# Patient Record
Sex: Male | Born: 1970 | Race: Black or African American | Marital: Married | Smoking: Former smoker
Health system: Southern US, Community
[De-identification: ages and names within clinical notes are randomized; demographics above are authoritative.]

## PROBLEM LIST (undated history)

## (undated) DIAGNOSIS — Z972 Presence of dental prosthetic device (complete) (partial): Secondary | ICD-10-CM

## (undated) DIAGNOSIS — M270 Developmental disorders of jaws: Secondary | ICD-10-CM

## (undated) DIAGNOSIS — H5210 Myopia, unspecified eye: Secondary | ICD-10-CM

## (undated) DIAGNOSIS — G43909 Migraine, unspecified, not intractable, without status migrainosus: Secondary | ICD-10-CM

## (undated) HISTORY — PX: VASECTOMY: SHX75

## (undated) HISTORY — DX: Migraine, unspecified, not intractable, without status migrainosus: G43.909

## (undated) HISTORY — PX: TONSILLECTOMY AND ADENOIDECTOMY: SUR1326

## (undated) HISTORY — DX: Presence of dental prosthetic device (complete) (partial): Z97.2

## (undated) HISTORY — DX: Myopia, unspecified eye: H52.10

## (undated) HISTORY — DX: Developmental disorders of jaws: M27.0

---

## 1998-08-23 HISTORY — PX: VASECTOMY: SHX75

## 2012-01-13 ENCOUNTER — Encounter: Payer: Self-pay | Admitting: Medical

## 2012-01-13 ENCOUNTER — Ambulatory Visit (INDEPENDENT_AMBULATORY_CARE_PROVIDER_SITE_OTHER): Payer: BC Managed Care – PPO | Admitting: Medical

## 2012-01-13 VITALS — BP 110/80 | HR 60 | Temp 97.6°F | Resp 16 | Ht 75.0 in | Wt 196.0 lb

## 2012-01-13 DIAGNOSIS — M79609 Pain in unspecified limb: Secondary | ICD-10-CM

## 2012-01-13 DIAGNOSIS — Z23 Encounter for immunization: Secondary | ICD-10-CM

## 2012-01-13 DIAGNOSIS — M79641 Pain in right hand: Secondary | ICD-10-CM

## 2012-01-13 DIAGNOSIS — Z0000 Encounter for general adult medical examination without abnormal findings: Secondary | ICD-10-CM

## 2012-01-13 LAB — COMPREHENSIVE METABOLIC PANEL
ALT: 29 U/L (ref 0–53)
AST: 20 U/L (ref 0–37)
Albumin: 5 g/dL (ref 3.5–5.2)
Alkaline Phosphatase: 79 U/L (ref 39–117)
Potassium: 4.1 mEq/L (ref 3.5–5.3)
Sodium: 140 mEq/L (ref 135–145)
Total Bilirubin: 0.4 mg/dL (ref 0.3–1.2)
Total Protein: 7.5 g/dL (ref 6.0–8.3)

## 2012-01-13 LAB — CBC WITH DIFFERENTIAL/PLATELET
Basophils Relative: 0 % (ref 0–1)
Eosinophils Absolute: 0.1 10*3/uL (ref 0.0–0.7)
Eosinophils Relative: 2 % (ref 0–5)
Lymphs Abs: 2.3 10*3/uL (ref 0.7–4.0)
MCH: 28.9 pg (ref 26.0–34.0)
MCHC: 33.8 g/dL (ref 30.0–36.0)
MCV: 85.6 fL (ref 78.0–100.0)
Platelets: 259 10*3/uL (ref 150–400)
RBC: 5.4 MIL/uL (ref 4.22–5.81)
RDW: 14.3 % (ref 11.5–15.5)

## 2012-01-13 LAB — POCT URINALYSIS DIPSTICK
Glucose, UA: NEGATIVE
Leukocytes, UA: NEGATIVE
Nitrite, UA: NEGATIVE
Protein, UA: NEGATIVE
Urobilinogen, UA: NEGATIVE

## 2012-01-13 LAB — LIPID PANEL: Total CHOL/HDL Ratio: 3.4 Ratio

## 2012-01-13 NOTE — Progress Notes (Signed)
Subjective:   HPI  Jorge Bradshaw is a 41 y.o. male who presents for a complete physical.  He is a new patient today.  He moved here few years ago from [place].  Formerly played basketball in college.  His only c/o today other than getting a physical is right hand with new growth that begin about a week ago.  The area has not resolved or worsened, but he is right handed and it is noticeable.  Not painful now though.   Reviewed their medical, surgical, family, social, medication, and allergy history and updated chart as appropriate.  Preventative care: Last tetanus >10 years. Gets flu shot yearly Prostate check - none prior Last dental visit - 3-4 years ago Last eye doctor visit over a year ago.    Past Medical History  Diagnosis Date  . Nearsightedness     wears glasses   . Wears partial dentures   . Migraines     Past Surgical History  Procedure Date  . Tonsillectomy and adenoidectomy     Family History  Problem Relation Age of Onset  . Cancer Father     prostate  . Cancer Maternal Uncle     died of colon cancer  . Heart disease Neg Hx   . Stroke Neg Hx   . Hypertension Neg Hx   . Diabetes Neg Hx     History   Social History  . Marital Status: Married    Spouse Name: N/A    Number of Children: N/A  . Years of Education: N/A   Occupational History  . supervisor [employer]   Social History Main Topics  . Smoking status: Current Some Day Smoker    Types: Cigarettes  . Smokeless tobacco: Not on file  . Alcohol Use: 0.6 oz/week    1 Cans of beer per week  . Drug Use: No  . Sexually Active: Not on file   Other Topics Concern  . Not on file   Social History Narrative   Married, 1 daughter graduating high school, exercise - 3 days per week walking, lifting weights    No current outpatient prescriptions on file prior to visit.    Allergies  Allergen Reactions  . Shellfish Allergy Anaphylaxis    Review of Systems Constitutional: -fever,  -chills, -sweats, -unexpected weight change, -anorexia, -fatigue Allergy: -sneezing, -itching, -congestion Dermatology: denies changing moles, rash, lumps, new worrisome lesions ENT: -runny nose, -ear pain, -sore throat, -hoarseness, -sinus pain, -teeth pain, -tinnitus, -hearing loss, -epistaxis Cardiology:  -chest pain, -palpitations, -edema, -orthopnea, -paroxysmal nocturnal dyspnea Respiratory: -cough, -shortness of breath, -dyspnea on exertion, -wheezing, -hemoptysis Gastroenterology: -abdominal pain, -nausea, -vomiting, -diarrhea, -constipation, -blood in stool, -changes in bowel movement, -dysphagia Hematology: -bleeding or bruising problems Musculoskeletal: -arthralgias, -myalgias, +joint swelling, -back pain, -neck pain, -cramping, -gait changes Ophthalmology: +vision changes, -eye redness, -itching, -discharge Urology: -dysuria, -difficulty urinating, -hematuria, -urinary frequency, -urgency, incontinence Neurology: -headache, -weakness, -tingling, -numbness, -speech abnormality, -memory loss, -falls, -dizziness Psychology:  -depressed mood, -agitation, -sleep problems    Objective:   Physical Exam  Filed Vitals:   01/13/12 0924  BP: 110/80  Pulse: 60  Temp: 97.6 F (36.4 C)  Resp: 16    General appearance: alert, no distress, WD/WN, tall AA male Skin: tattoos right upper arm, upper back HEENT: normocephalic, conjunctiva/corneas normal, sclerae anicteric, PERRLA, EOMi, nares patent, no discharge or erythema, pharynx normal Oral cavity: MMM, tongue normal, teeth with upper denture, others in good repair Neck: supple, no lymphadenopathy, no  thyromegaly, no masses, normal ROM, no bruits Chest: non tender, normal shape and expansion Heart: RRR, normal S1, S2, no murmurs Lungs: CTA bilaterally, no wheezes, rhonchi, or rales Abdomen: +bs, soft, non tender, non distended, no masses, no hepatomegaly, no splenomegaly, no bruits Back: non tender, normal ROM, no  scoliosis Musculoskeletal: right hand volar surface, 8mm oval dense lesion between 2nd and 3rd metacarpal, possibly a cyst or ganglion cyst, nontender, not within tendon sheath, otherwise upper extremities non tender, no obvious deformity, normal ROM throughout, lower extremities non tender, no obvious deformity, normal ROM throughout Extremities: no edema, no cyanosis, no clubbing Pulses: 2+ symmetric, upper and lower extremities, normal cap refill Neurological: alert, oriented x 3, CN2-12 intact, strength normal upper extremities and lower extremities, sensation normal throughout, DTRs 2+ throughout, no cerebellar signs, gait normal Psychiatric: normal affect, behavior normal, pleasant  GU: normal male external genitalia, nontender, no masses, no hernia, no lymphadenopathy Rectal: deferred  Assessment and Plan :    Encounter Diagnoses  Name Primary?  . Routine general medical examination at a health care facility Yes  . Need for Tdap vaccination   . Hand pain, right     Physical exam - discussed healthy lifestyle, diet, exercise, preventative care, vaccinations, and addressed their concerns.    Vaccine counseling today, VIS given, and Tdap given.  Hand pain - possible cyst.  Reassured, and we will use watch and wait approach for now.  Follow-up pending labs.

## 2012-01-13 NOTE — Patient Instructions (Signed)
Preventative Care for Adults, Male       REGULAR HEALTH EXAMS:  A routine yearly physical is a good way to check in with your primary care provider about your health and preventive screening. It is also an opportunity to share updates about your health and any concerns you have, and receive a thorough all-over exam.   Most health insurance companies pay for at least some preventative services.  Check with your health plan for specific coverages.  WHAT PREVENTATIVE SERVICES DO MEN NEED?  Adult men should have their weight and blood pressure checked regularly.   Men age 35 and older should have their cholesterol levels checked regularly.  Beginning at age 50 and continuing to age 75, men should be screened for colorectal cancer.  Certain people should may need continued testing until age 85.  Other cancer screening may include exams for testicular and prostate cancer.  Updating vaccinations is part of preventative care.  Vaccinations help protect against diseases such as the flu.  Lab tests are generally done as part of preventative care to screen for anemia and blood disorders, to screen for problems with the kidneys and liver, to screen for bladder problems, to check blood sugar, and to check your cholesterol level.  Preventative services generally include counseling about diet, exercise, avoiding tobacco, drugs, excessive alcohol consumption, and sexually transmitted infections.    GENERAL RECOMMENDATIONS FOR GOOD HEALTH:  Healthy diet:  Eat a variety of foods, including fruit, vegetables, animal or vegetable protein, such as meat, fish, chicken, and eggs, or beans, lentils, tofu, and grains, such as rice.  Drink plenty of water daily.  Decrease saturated fat in the diet, avoid lots of red meat, processed foods, sweets, fast foods, and fried foods.  Exercise:  Aerobic exercise helps maintain good heart health. At least 30-40 minutes of moderate-intensity exercise is recommended.  For example, a brisk walk that increases your heart rate and breathing. This should be done on most days of the week.   Find a type of exercise or a variety of exercises that you enjoy so that it becomes a part of your daily life.  Examples are running, walking, swimming, water aerobics, and biking.  For motivation and support, explore group exercise such as aerobic class, spin class, Zumba, Yoga,or  martial arts, etc.    Set exercise goals for yourself, such as a certain weight goal, walk or run in a race such as a 5k walk/run.  Speak to your primary care provider about exercise goals.  Disease prevention:  If you smoke or chew tobacco, find out from your caregiver how to quit. It can literally save your life, no matter how long you have been a tobacco user. If you do not use tobacco, never begin.   Maintain a healthy diet and normal weight. Increased weight leads to problems with blood pressure and diabetes.   The Body Mass Index or BMI is a way of measuring how much of your body is fat. Having a BMI above 27 increases the risk of heart disease, diabetes, hypertension, stroke and other problems related to obesity. Your caregiver can help determine your BMI and based on it develop an exercise and dietary program to help you achieve or maintain this important measurement at a healthful level.  High blood pressure causes heart and blood vessel problems.  Persistent high blood pressure should be treated with medicine if weight loss and exercise do not work.   Fat and cholesterol leaves deposits in your arteries   that can block them. This causes heart disease and vessel disease elsewhere in your body.  If your cholesterol is found to be high, or if you have heart disease or certain other medical conditions, then you may need to have your cholesterol monitored frequently and be treated with medication.   Ask if you should have a stress test if your history suggests this. A stress test is a test done on  a treadmill that looks for heart disease. This test can find disease prior to there being a problem.  Avoid drinking alcohol in excess (more than two drinks per day).  Avoid use of street drugs. Do not share needles with anyone. Ask for professional help if you need assistance or instructions on stopping the use of alcohol, cigarettes, and/or drugs.  Brush your teeth twice a day with fluoride toothpaste, and floss once a day. Good oral hygiene prevents tooth decay and gum disease. The problems can be painful, unattractive, and can cause other health problems. Visit your dentist for a routine oral and dental check up and preventive care every 6-12 months.   Look at your skin regularly.  Use a mirror to look at your back. Notify your caregivers of changes in moles, especially if there are changes in shapes, colors, a size larger than a pencil eraser, an irregular border, or development of new moles.  Safety:  Use seatbelts 100% of the time, whether driving or as a passenger.  Use safety devices such as hearing protection if you work in environments with loud noise or significant background noise.  Use safety glasses when doing any work that could send debris in to the eyes.  Use a helmet if you ride a bike or motorcycle.  Use appropriate safety gear for contact sports.  Talk to your caregiver about gun safety.  Use sunscreen with a SPF (or skin protection factor) of 15 or greater.  Lighter skinned people are at a greater risk of skin cancer. Don't forget to also wear sunglasses in order to protect your eyes from too much damaging sunlight. Damaging sunlight can accelerate cataract formation.   Practice safe sex. Use condoms. Condoms are used for birth control and to help reduce the spread of sexually transmitted infections (or STIs).  Some of the STIs are gonorrhea (the clap), chlamydia, syphilis, trichomonas, herpes, HPV (human papilloma virus) and HIV (human immunodeficiency virus) which causes AIDS.  The herpes, HIV and HPV are viral illnesses that have no cure. These can result in disability, cancer and death.   Keep carbon monoxide and smoke detectors in your home functioning at all times. Change the batteries every 6 months or use a model that plugs into the wall.   Vaccinations:  Stay up to date with your tetanus shots and other required immunizations. You should have a booster for tetanus every 10 years. Be sure to get your flu shot every year, since 5%-20% of the [place] population comes down with the flu. The flu vaccine changes each year, so being vaccinated once is not enough. Get your shot in the fall, before the flu season peaks.   Other vaccines to consider:  Pneumococcal vaccine to protect against certain types of pneumonia.  This is normally recommended for adults age 65 or older.  However, adults younger than 40 years old with certain underlying conditions such as diabetes, heart or lung disease should also receive the vaccine.  Shingles vaccine to protect against Varicella Zoster if you are older than age 60, or younger   than 40 years old with certain underlying illness.  Hepatitis A vaccine to protect against a form of infection of the liver by a virus acquired from food.  Hepatitis B vaccine to protect against a form of infection of the liver by a virus acquired from blood or body fluids, particularly if you work in health care.  If you plan to travel internationally, check with your local health department for specific vaccination recommendations.  Cancer Screening:  Most routine colon cancer screening begins at the age of 50. On a yearly basis, doctors may provide special easy to use take-home tests to check for hidden blood in the stool. Sigmoidoscopy or colonoscopy can detect the earliest forms of colon cancer and is life saving. These tests use a small camera at the end of a tube to directly examine the colon. Speak to your caregiver about this at age 50, when routine  screening begins (and is repeated every 5 years unless early forms of pre-cancerous polyps or small growths are found).   At the age of 50 men usually start screening for prostate cancer every year. Screening may begin at a younger age for those with higher risk. Those at higher risk include African-Americans or having a family history of prostate cancer. There are two types of tests for prostate cancer:   Prostate-specific antigen (PSA) testing. Recent studies raise questions about prostate cancer using PSA and you should discuss this with your caregiver.   Digital rectal exam (in which your doctor's lubricated and gloved finger feels for enlargement of the prostate through the anus).   Screening for testicular cancer.  Do a monthly exam of your testicles. Gently roll each testicle between your thumb and fingers, feeling for any abnormal lumps. The best time to do this is after a hot shower or bath when the tissues are looser. Notify your caregivers of any lumps, tenderness or changes in size or shape immediately.     

## 2012-03-09 ENCOUNTER — Telehealth: Payer: Self-pay

## 2012-03-09 ENCOUNTER — Encounter: Payer: Self-pay | Admitting: Family Medicine

## 2012-03-09 ENCOUNTER — Ambulatory Visit (INDEPENDENT_AMBULATORY_CARE_PROVIDER_SITE_OTHER): Payer: BC Managed Care – PPO | Admitting: Family Medicine

## 2012-03-09 VITALS — BP 118/80 | HR 63 | Wt 199.0 lb

## 2012-03-09 DIAGNOSIS — L989 Disorder of the skin and subcutaneous tissue, unspecified: Secondary | ICD-10-CM

## 2012-03-09 NOTE — Telephone Encounter (Signed)
Pt has appt with ortho ahd hand specialist [phone] [address] July 23 @ 9:30 am pt aware

## 2012-03-09 NOTE — Progress Notes (Signed)
  Subjective:    Patient ID: Jorge Bradshaw, male    DOB: 20-May-1971, 41 y.o.   MRN: [id number]  HPI He is here for evaluation of continued difficulty with a lesion in the palm of his right hand. Apparently is getting larger now interfering with his ability to function.   Review of Systems     Objective:   Physical Exam [place] round lesion noted in the palm of the hand. It does not move with motion of the tendons.       Assessment & Plan:   1. Hand lesion  Ambulatory referral to Orthopedic Surgery   since this is giving him more difficulty with his ADLs, referral is appropriate

## 2012-03-23 HISTORY — PX: HAND SURGERY: SHX662

## 2012-08-25 ENCOUNTER — Encounter: Payer: Self-pay | Admitting: Medical

## 2012-08-25 ENCOUNTER — Ambulatory Visit (INDEPENDENT_AMBULATORY_CARE_PROVIDER_SITE_OTHER): Payer: 59 | Admitting: Medical

## 2012-08-25 VITALS — BP 120/80 | HR 92 | Temp 98.5°F | Resp 18 | Wt 198.0 lb

## 2012-08-25 DIAGNOSIS — J40 Bronchitis, not specified as acute or chronic: Secondary | ICD-10-CM

## 2012-08-25 MED ORDER — HYDROCODONE-HOMATROPINE 5-1.5 MG/5ML PO SYRP: 5.0000 mL | ORAL_SOLUTION | Freq: Three times a day (TID) | ORAL | Status: DC | PRN

## 2012-08-25 MED ORDER — AZITHROMYCIN 500 MG PO TABS: 500.0000 mg | ORAL_TABLET | Freq: Every day | ORAL | Status: AC

## 2012-08-25 NOTE — Progress Notes (Signed)
Subjective:  Jorge Bradshaw is a 42 y.o. male who presents for 2 wk hx/o cough.  Was originally having cough, congestion in chest, got a little better, but then worse again.  Going on  2wk now.  Having productive sputum.  Denies fever, sinus pressure, ear pain, sore throat, edema, SOB, wheezing, NVD. Using Mucinex, Vicks Vapor rub, cough lozenges.  He is a non smoker.  No other aggravating or relieving factors.  No other c/o.  The following portions of the patient's history were reviewed and updated as appropriate: allergies, current medications, past family history, past medical history, past social history, past surgical history and problem list.   Objective:   Filed Vitals:   08/25/12 1244  BP: 120/80  Pulse: 92  Temp: 98.5 F (36.9 C)  Resp: 18    General appearance: Alert, WD/WN, no distress                             Skin: warm, no rash, no diaphoresis                           Head: no sinus tenderness                            Eyes: conjunctiva normal, corneas clear, PERRLA                            Ears: pearly TMs, external ear canals normal                          Nose: septum midline, turbinates swollen, with erythema and clear discharge             Mouth/throat: MMM, tongue normal, mild pharyngeal erythema                           Neck: supple, no adenopathy, no thyromegaly, nontender                          Heart: RRR, normal S1, S2, no murmurs                         Lungs: +bronchial breath sounds, +scattered rhonchi, no wheezes, no rales                Extremities: no edema, nontender     Assessment and Plan:   Encounter Diagnosis  Name Primary?  . Bronchitis Yes    Prescription given today for Azithromycin and Hycodan syrup as below.  Discussed diagnosis and treatment.  Suggested symptomatic OTC remedies for cough and congestion.  Tylenol or Ibuprofen OTC for fever and malaise.  Call/return in 2-3 days if symptoms are worse or not improving.

## 2013-01-05 ENCOUNTER — Encounter: Payer: Self-pay | Admitting: Medical

## 2013-01-05 ENCOUNTER — Ambulatory Visit (INDEPENDENT_AMBULATORY_CARE_PROVIDER_SITE_OTHER): Payer: 59 | Admitting: Medical

## 2013-01-05 VITALS — BP 120/80 | HR 60 | Temp 97.6°F | Resp 16 | Wt 203.0 lb

## 2013-01-05 DIAGNOSIS — R2231 Localized swelling, mass and lump, right upper limb: Secondary | ICD-10-CM

## 2013-01-05 DIAGNOSIS — R229 Localized swelling, mass and lump, unspecified: Secondary | ICD-10-CM

## 2013-01-05 NOTE — Progress Notes (Signed)
Subjective: Here for recheck on hand.  5/13 ended up seeing hand surgeon who removed a benign lesion from between his right hand 2nd and 3rd fingers.  Did fine up until 1/14 had been using lotion to help with scar tissue.   Now he has new knots in the same area.    Objective: Gen: wd, wn, nad Skin: unremarkable, small surgical scar of right volar hand along 2nd metacarpal MSK: right volar hand with 3 distinct masses/nodular tissue, nodules, raised along volar 3rd 4th and 5th metacarpals, no obvious trigger finger or locking of fingers, finger ROM normal Neurovascularly intact right UE  Assessment: Encounter Diagnosis  Name Primary?  . Mass of hand, right Yes   Plan: Most likely Dupuytren's contracture vs bone spurs causing pain and raised deformity.  Refer back to [employer] [facility] [place].

## 2013-01-24 ENCOUNTER — Ambulatory Visit (INDEPENDENT_AMBULATORY_CARE_PROVIDER_SITE_OTHER): Payer: 59 | Admitting: Medical

## 2013-01-24 ENCOUNTER — Encounter: Payer: Self-pay | Admitting: Medical

## 2013-01-24 VITALS — BP 120/82 | HR 58 | Temp 97.6°F | Resp 16 | Ht 75.0 in | Wt 204.0 lb

## 2013-01-24 DIAGNOSIS — Z125 Encounter for screening for malignant neoplasm of prostate: Secondary | ICD-10-CM

## 2013-01-24 DIAGNOSIS — Z87891 Personal history of nicotine dependence: Secondary | ICD-10-CM

## 2013-01-24 DIAGNOSIS — Z0000 Encounter for general adult medical examination without abnormal findings: Secondary | ICD-10-CM

## 2013-01-24 DIAGNOSIS — R2231 Localized swelling, mass and lump, right upper limb: Secondary | ICD-10-CM

## 2013-01-24 DIAGNOSIS — R229 Localized swelling, mass and lump, unspecified: Secondary | ICD-10-CM

## 2013-01-24 LAB — POCT URINALYSIS DIPSTICK
Glucose, UA: NEGATIVE
Ketones, UA: NEGATIVE
Leukocytes, UA: NEGATIVE
Protein, UA: NEGATIVE
Urobilinogen, UA: NEGATIVE

## 2013-01-24 NOTE — Progress Notes (Signed)
Subjective:   HPI  Jorge Bradshaw is a 42 y.o. male who presents for a complete physical.   Preventative care: Last ophthalmology visit:yes- [facility] Last dental visit:n/a Last colonoscopy:n/a Last prostate exam: n/a Last EKG:n/a Last labs:01/13/12  Prior vaccinations: TD or Tdap:01/13/12 Influenza:n/a Pneumococcal:n/a Shingles/Zostavax/a:  Advanced directive:n/a Health care power of attorney:n/a Living will:n/a  Concerns: none  Reviewed their medical, surgical, family, social, medication, and allergy history and updated chart as appropriate.   Past Medical History  Diagnosis Date  . Nearsightedness     wears glasses   . Wears partial dentures   . Migraines   . Torus palatinus     Past Surgical History  Procedure Laterality Date  . Tonsillectomy and adenoidectomy    . Hand surgery  8/13    volar right hand mass excised - Dr. Dairl Ponder    Family History  Problem Relation Age of Onset  . Cancer Father     prostate  . Cancer Maternal Uncle     died of colon cancer  . Heart disease Neg Hx   . Stroke Neg Hx   . Hypertension Neg Hx   . Diabetes Neg Hx     History   Social History  . Marital Status: Married    Spouse Name: N/A    Number of Children: N/A  . Years of Education: N/A   Occupational History  . supervisor [employer]   Social History Main Topics  . Smoking status: Former Smoker    Types: Cigarettes    Quit date: 02/24/2012  . Smokeless tobacco: Not on file  . Alcohol Use: 0.6 oz/week    1 Cans of beer per week  . Drug Use: No  . Sexually Active: Not on file   Other Topics Concern  . 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   No current facility-administered medications on file prior to visit.    Allergies  Allergen  Reactions  . Shellfish Allergy Anaphylaxis     Review of Systems Constitutional: -fever, -chills, -sweats, -unexpected weight change, -decreased appetite, -fatigue Allergy: -sneezing, -itching, -congestion Dermatology: -changing moles, --rash, -lumps ENT: -runny nose, -ear pain, -sore throat, -hoarseness, -sinus pain, -teeth pain, - ringing in ears, -hearing loss, -nosebleeds Cardiology: -chest pain, -palpitations, -swelling, -difficulty breathing when lying flat, -waking up short of breath Respiratory: -cough, -shortness of breath, -difficulty breathing with exercise or exertion, -wheezing, -coughing up blood Gastroenterology: -abdominal pain, -nausea, -vomiting, -diarrhea, -constipation, -blood in stool, -changes in bowel movement, -difficulty swallowing or eating Hematology: -bleeding, -bruising  Musculoskeletal: -joint aches, -muscle aches, -joint swelling, -back pain, -neck pain, -cramping, -changes in gait Ophthalmology: denies vision changes, eye redness, itching, discharge Urology: -burning with urination, -difficulty urinating, -blood in urine, -urinary frequency, -urgency, -incontinence Neurology: -headache, -weakness, -tingling, -numbness, -memory loss, -falls, -dizziness Psychology: -depressed mood, -agitation, -sleep problems     Objective:   Physical Exam  Vitals and nurse notes reviwed  General appearance: alert, no distress, WD/WN, tall AA male  Skin: tattoos right upper arm, upper back  HEENT: normocephalic, conjunctiva/corneas normal, sclerae anicteric, PERRLA, EOMi, nares patent, no discharge or erythema, pharynx normal  Oral cavity: MMM, tongue normal, teeth with upper denture, torus palatinus noted, teeth in good repair  Neck: supple, no lymphadenopathy, no thyromegaly, no masses,  normal ROM, no bruits  Chest: non tender, normal shape and expansion  Heart: RRR, normal S1, S2, no murmurs  Lungs: CTA bilaterally, no wheezes, rhonchi, or rales  Abdomen: +bs, soft,  non tender, non distended, no masses, no hepatomegaly, no splenomegaly, no bruits  Back: non tender, normal ROM, no scoliosis  Musculoskeletal: right hand volar surface with 3 distinct masses/nodular tissue, nodules, raised along volar 3rd 4th and 5th metacarpals, no obvious trigger finger or locking of fingers, finger ROM normal, otherwise upper extremities non tender, no obvious deformity, normal ROM throughout, lower extremities non tender, no obvious deformity, normal ROM throughout  Extremities: no edema, no cyanosis, no clubbing  Pulses: 2+ symmetric, upper and lower extremities, normal cap refill  Neurological: alert, oriented x 3, CN2-12 intact, strength normal upper extremities and lower extremities, sensation normal throughout, DTRs 2+ throughout, no cerebellar signs, gait normal  Psychiatric: normal affect, behavior normal, pleasant  GU: normal male external genitalia, circumcised, nontender, no masses, no hernia, no lymphadenopathy  Rectal: anus normal tone, prostate WNL, no nodules   Assessment and Plan :      Encounter Diagnoses  Name Primary?  . Routine general medical examination at a health care facility Yes  . Screening for prostate cancer   . Former smoker   . Mass of hand, right     Physical exam - discussed healthy lifestyle, diet, exercise, preventative care, vaccinations, and addressed their concerns.  Reviewed the normal labs from last year.  He will get me copy of his recent labs through [facility] health [facility].  We discussed risks/benefits of PSA testing.  Baseline PSA test today.    Glad to hear he stopped tobacco.   congratulated him on this.    Mass of hand - f/u with hand surgeon as planned.   F/u pending labs

## 2013-01-24 NOTE — Patient Instructions (Signed)
Dr. David Civils, dentist [address], [place], [place] [address] [phone] [email or web address]    

## 2013-01-25 LAB — PSA: PSA: 0.77 ng/mL (ref <=4.00)

## 2013-01-26 ENCOUNTER — Encounter: Payer: Self-pay | Admitting: Medical

## 2013-01-26 ENCOUNTER — Ambulatory Visit (INDEPENDENT_AMBULATORY_CARE_PROVIDER_SITE_OTHER): Payer: 59 | Admitting: Medical

## 2013-01-26 VITALS — BP 120/80 | HR 68 | Temp 97.6°F | Resp 16 | Wt 199.0 lb

## 2013-01-26 DIAGNOSIS — R300 Dysuria: Secondary | ICD-10-CM

## 2013-01-26 DIAGNOSIS — R369 Urethral discharge, unspecified: Secondary | ICD-10-CM

## 2013-01-26 LAB — POCT URINALYSIS DIPSTICK
Bilirubin, UA: NEGATIVE
Glucose, UA: NEGATIVE
Nitrite, UA: NEGATIVE
Urobilinogen, UA: NEGATIVE

## 2013-01-26 MED ORDER — CIPROFLOXACIN HCL 500 MG PO TABS: 500.0000 mg | ORAL_TABLET | Freq: Two times a day (BID) | ORAL | Status: DC

## 2013-01-26 NOTE — Progress Notes (Signed)
Quick Note:  Patient is aware of lab results. CLS ______

## 2013-01-26 NOTE — Progress Notes (Signed)
Subjective:  Jorge Bradshaw is a 42 y.o. male who presents for possible UTI.  Just saw him few days ago for physical here. Yesterday started having burning with urination.  No pain, but doesn't feel normal.  Urinates 10-11 times daily, but drinks a lot of water daily.  No previous similar.   Has clear penile discharge started yesterday.  He is married, monogamous x 18 yeas, no concern for STD.  He and wife did have intercourse 2 nights ago.  No other aggravating or relieving factors.    No other c/o.  The following portions of the patient's history were reviewed and updated as appropriate: allergies, current medications, past family history, past medical history, past social history, past surgical history and problem list.  ROS Otherwise as in subjective above  Objective: Physical Exam  Vital signs reviewed  General appearance: alert, no distress, WD/WN Abdomen: +bs, soft, non tender, non distended, no masses, no hepatomegaly, no splenomegaly GU: slight clear discharge from meatus, otherwise normal GU exam   Assessment: Encounter Diagnoses  Name Primary?  . Dysuria Yes  . Penile discharge      Plan: Etiology - irritation vs UTI vs prostatitis vs STD.   Labs today.   If worsening begin cipro, otherwise await labs.

## 2013-01-29 LAB — GC/CHLAMYDIA PROBE AMP
CT Probe RNA: POSITIVE — AB
GC Probe RNA: NEGATIVE

## 2013-01-30 ENCOUNTER — Other Ambulatory Visit: Payer: Self-pay | Admitting: Medical

## 2013-01-30 MED ORDER — AZITHROMYCIN 1 G PO PACK: 1.0000 | PACK | Freq: Once | ORAL | Status: DC

## 2013-09-18 ENCOUNTER — Encounter: Payer: Self-pay | Admitting: Family Medicine

## 2013-09-18 ENCOUNTER — Ambulatory Visit (INDEPENDENT_AMBULATORY_CARE_PROVIDER_SITE_OTHER): Payer: 59 | Admitting: Family Medicine

## 2013-09-18 VITALS — BP 120/82 | HR 76 | Wt 204.0 lb

## 2013-09-18 DIAGNOSIS — M25561 Pain in right knee: Secondary | ICD-10-CM

## 2013-09-18 DIAGNOSIS — M25469 Effusion, unspecified knee: Secondary | ICD-10-CM

## 2013-09-18 DIAGNOSIS — M25569 Pain in unspecified knee: Secondary | ICD-10-CM

## 2013-09-18 DIAGNOSIS — M25461 Effusion, right knee: Secondary | ICD-10-CM

## 2013-09-18 NOTE — Patient Instructions (Signed)
You can take 4 Advil 3 times per day it

## 2013-09-18 NOTE — Progress Notes (Signed)
   Subjective:    Patient ID: Jorge Bradshaw, male    DOB: 06/22/1971, 43 y.o.   MRN: [id number]  HPI  Approximately 2 weeks ago he noted the onset of right knee swelling. He did play basketball the night before but does not remember any injury. He complains of swelling, popping sensation and pain.    Review of Systems     Objective:   Physical Exam A mild effusion is noted. Slight medial joint line tenderness is noted. Anterior drawer negative. Medial and lateral collateral ligaments intact. McMurray's testing medially did cause some pain laterally.       Assessment & Plan:  Effusion of knee joint right  Right knee pain  his exam is suggestive of a medial meniscal tear. I will treat him conservatively with relative rest and recheck this in 2 weeks. Discussed the fact that if the symptoms get worse he should let me know sooner.

## 2013-10-02 ENCOUNTER — Ambulatory Visit (INDEPENDENT_AMBULATORY_CARE_PROVIDER_SITE_OTHER): Payer: 59 | Admitting: Family Medicine

## 2013-10-02 ENCOUNTER — Encounter: Payer: Self-pay | Admitting: Family Medicine

## 2013-10-02 VITALS — BP 118/80 | HR 60 | Wt 203.0 lb

## 2013-10-02 DIAGNOSIS — M25569 Pain in unspecified knee: Secondary | ICD-10-CM

## 2013-10-02 DIAGNOSIS — M25561 Pain in right knee: Secondary | ICD-10-CM

## 2013-10-02 NOTE — Progress Notes (Signed)
   Subjective:    Patient ID: Jorge Bradshaw, male    DOB: 07/02/1971, 43 y.o.   MRN: [id number]  HPI He is here for recheck on right knee pain. He notes less discomfort but still has trouble going up steps.   Review of Systems     Objective:   Physical Exam Exam of the right knee does show a small effusion. No tenderness to palpation over the joint line. McMurray testing negative. Anterior drawer negative.       Assessment & Plan:  Right knee pain  recommend continued conservative care. Recommend he wait another week before physical activity and slowly increase his physical activity based on discomfort. Please see AVS for further information. He'll return here if continued difficulty

## 2013-10-02 NOTE — Patient Instructions (Signed)
Go ahead and start back into weight training. Once it no longer hurts going up steps then you can start light jogging increasing to more medium speed and you can sprint, then do cutting maneuvers and then jump. 2-1 ratio in terms of one week out take she 2 weeks to get back

## 2014-01-28 ENCOUNTER — Encounter: Payer: 59 | Admitting: Medical

## 2014-02-12 ENCOUNTER — Encounter: Payer: Self-pay | Admitting: Family Medicine

## 2014-02-12 ENCOUNTER — Ambulatory Visit (INDEPENDENT_AMBULATORY_CARE_PROVIDER_SITE_OTHER): Payer: 59 | Admitting: Family Medicine

## 2014-02-12 VITALS — BP 100/72 | Ht 76.0 in | Wt 209.0 lb

## 2014-02-12 DIAGNOSIS — J069 Acute upper respiratory infection, unspecified: Secondary | ICD-10-CM

## 2014-02-12 DIAGNOSIS — B9789 Other viral agents as the cause of diseases classified elsewhere: Secondary | ICD-10-CM

## 2014-02-12 DIAGNOSIS — Z8042 Family history of malignant neoplasm of prostate: Secondary | ICD-10-CM | POA: Insufficient documentation

## 2014-02-12 DIAGNOSIS — Z0000 Encounter for general adult medical examination without abnormal findings: Secondary | ICD-10-CM

## 2014-02-12 LAB — CBC WITH DIFFERENTIAL/PLATELET
BASOS PCT: 0 % (ref 0–1)
Basophils Absolute: 0 10*3/uL (ref 0.0–0.1)
Eosinophils Absolute: 0.1 10*3/uL (ref 0.0–0.7)
Eosinophils Relative: 1 % (ref 0–5)
HEMATOCRIT: 45.1 % (ref 39.0–52.0)
HEMOGLOBIN: 15.9 g/dL (ref 13.0–17.0)
LYMPHS ABS: 1.8 10*3/uL (ref 0.7–4.0)
Lymphocytes Relative: 28 % (ref 12–46)
MCH: 30 pg (ref 26.0–34.0)
MCHC: 35.3 g/dL (ref 30.0–36.0)
MCV: 85.1 fL (ref 78.0–100.0)
MONO ABS: 0.4 10*3/uL (ref 0.1–1.0)
MONOS PCT: 6 % (ref 3–12)
NEUTROS ABS: 4.3 10*3/uL (ref 1.7–7.7)
Neutrophils Relative %: 65 % (ref 43–77)
Platelets: 241 10*3/uL (ref 150–400)
RBC: 5.3 MIL/uL (ref 4.22–5.81)
RDW: 14.2 % (ref 11.5–15.5)
WBC: 6.6 10*3/uL (ref 4.0–10.5)

## 2014-02-12 LAB — COMPREHENSIVE METABOLIC PANEL
ALBUMIN: 4.4 g/dL (ref 3.5–5.2)
ALT: 23 U/L (ref 0–53)
AST: 21 U/L (ref 0–37)
Alkaline Phosphatase: 68 U/L (ref 39–117)
BUN: 12 mg/dL (ref 6–23)
CALCIUM: 9.4 mg/dL (ref 8.4–10.5)
CHLORIDE: 101 meq/L (ref 96–112)
CO2: 26 meq/L (ref 19–32)
Creat: 1.08 mg/dL (ref 0.50–1.35)
GLUCOSE: 75 mg/dL (ref 70–99)
POTASSIUM: 4.1 meq/L (ref 3.5–5.3)
SODIUM: 136 meq/L (ref 135–145)
TOTAL PROTEIN: 7 g/dL (ref 6.0–8.3)
Total Bilirubin: 0.6 mg/dL (ref 0.2–1.2)

## 2014-02-12 LAB — POCT URINALYSIS DIPSTICK
GLUCOSE UA: NEGATIVE
KETONES UA: NEGATIVE
LEUKOCYTES UA: NEGATIVE
Nitrite, UA: NEGATIVE
PH UA: 5
Protein, UA: NEGATIVE
RBC UA: NEGATIVE
Spec Grav, UA: <=1.005
Urobilinogen, UA: NEGATIVE

## 2014-02-12 LAB — LIPID PANEL
CHOLESTEROL: 187 mg/dL (ref 0–200)
HDL: 48 mg/dL (ref >39)
LDL Cholesterol: 126 mg/dL — ABNORMAL HIGH (ref 0–99)
TRIGLYCERIDES: 63 mg/dL (ref <150)
Total CHOL/HDL Ratio: 3.9 Ratio
VLDL: 13 mg/dL (ref 0–40)

## 2014-02-12 LAB — HEMOCCULT GUIAC POC 1CARD (OFFICE)

## 2014-02-12 NOTE — Progress Notes (Signed)
   Subjective:    Patient ID: Jorge GianottiGregory Bradshaw, male    DOB: 10/09/1970, 43 y.o.   MRN: [id number]  HPI He is here for complete examination. He does have a one-week history of dry cough with headache, decreased appetite as well as polyuria and polydipsia. He did have one episode of diarrhea. He otherwise has enjoyed excellent health. He does have a family history of prostate cancer. His work and home life are going well. He keeps himself physically active. Social and family history were reviewed. He apparently is potentially going to be helping raise a set of twins from his nephew.   Review of Systems  All other systems reviewed and are negative.      Objective:   Physical Exam BP 100/72  Ht 6\' 4"  (1.93 m)  Wt 209 lb (94.802 kg)  BMI 25.45 kg/m2  General Appearance:    Alert, cooperative, no distress, appears stated age  Head:    Normocephalic, without obvious abnormality, atraumatic  Eyes:    PERRL, conjunctiva/corneas clear, EOM's intact, fundi    benign  Ears:    Normal TM's and external ear canals  Nose:   Nares normal, mucosa normal, no drainage or sinus   tenderness  Throat:   Lips, mucosa, and tongue normal; teeth and gums normal  Neck:   Supple, no lymphadenopathy;  thyroid:  no   enlargement/tenderness/nodules; no carotid   bruit or JVD  Back:    Spine nontender, no curvature, ROM normal, no CVA     tenderness  Lungs:     Clear to auscultation bilaterally without wheezes, rales or     ronchi; respirations unlabored  Chest Wall:    No tenderness or deformity   Heart:    Regular rate and rhythm, S1 and S2 normal, no murmur, rub   or gallop  Breast Exam:    No chest wall tenderness, masses or gynecomastia  Abdomen:     Soft, non-tender, nondistended, normoactive bowel sounds,    no masses, no hepatosplenomegaly  Genitalia:    Normal male external genitalia without lesions.  Testicles without masses.  No inguinal hernias.  Rectal:    Normal sphincter tone, no masses or  tenderness; guaiac negative stool.  Prostate smooth, no nodules, not enlarged.  Extremities:   No clubbing, cyanosis or edema  Pulses:   2+ and symmetric all extremities  Skin:   Skin color, texture, turgor normal, no rashes or lesions  Lymph nodes:   Cervical, supraclavicular, and axillary nodes normal  Neurologic:   CNII-XII intact, normal strength, sensation and gait; reflexes 2+ and symmetric throughout          Psych:   Normal mood, affect, hygiene and grooming.          Assessment & Plan:  Routine general medical examination at a health care facility - Plan: Urinalysis Dipstick, CBC with Differential, Comprehensive metabolic panel, Lipid panel  Viral URI with cough  Family history of prostate cancer in father  recommend supportive care for the URI.

## 2014-02-12 NOTE — Patient Instructions (Signed)
Treat your symptoms. I find NyQuil works the best with coughing at night. He still having trouble within the week let me know

## 2014-07-11 ENCOUNTER — Encounter: Payer: Self-pay | Admitting: Family Medicine

## 2014-07-11 ENCOUNTER — Ambulatory Visit (INDEPENDENT_AMBULATORY_CARE_PROVIDER_SITE_OTHER): Payer: 59 | Admitting: Family Medicine

## 2014-07-11 VITALS — BP 120/80 | HR 57 | Wt 214.0 lb

## 2014-07-11 DIAGNOSIS — M25461 Effusion, right knee: Secondary | ICD-10-CM

## 2014-07-11 DIAGNOSIS — M25561 Pain in right knee: Secondary | ICD-10-CM

## 2014-07-11 NOTE — Progress Notes (Signed)
   Subjective:    Patient ID: Jorge GianottiGregory Bradshaw, male    DOB: 09/17/1970, 43 y.o.   MRN: [id number]  HPI He is here for reevaluation of right knee pain. His knee was doing well until July when he was doing squats and did feel a popping sensation. Since then his knee has been getting worse and worse with more popping but no locking or grinding. He now states that it is interfering with his ADLs   Review of Systems     Objective:   Physical Exam Right knee exam shows a small effusion with tenderness along the medial joint line. McMurray's testing is pain medially, lateral was negative. Medial and lateral collateral ligaments intact. Anterior cruciate ligament intact       Assessment & Plan:  Right knee pain - Plan: MR Knee Right Wo Contrast  Knee effusion, right - Plan: MR Knee Right Wo Contrast

## 2014-07-20 ENCOUNTER — Ambulatory Visit
Admission: RE | Admit: 2014-07-20 | Discharge: 2014-07-20 | Disposition: A | Discharge status: Home / Self Care | Payer: 59 | Source: Ambulatory Visit | Attending: Family Medicine | Admitting: Family Medicine

## 2014-07-20 DIAGNOSIS — M25561 Pain in right knee: Secondary | ICD-10-CM

## 2014-07-20 DIAGNOSIS — M25461 Effusion, right knee: Secondary | ICD-10-CM

## 2014-07-23 ENCOUNTER — Ambulatory Visit (INDEPENDENT_AMBULATORY_CARE_PROVIDER_SITE_OTHER): Payer: 59 | Admitting: Family Medicine

## 2014-07-23 ENCOUNTER — Encounter: Payer: Self-pay | Admitting: Family Medicine

## 2014-07-23 DIAGNOSIS — M222X1 Patellofemoral disorders, right knee: Secondary | ICD-10-CM

## 2014-07-23 NOTE — Progress Notes (Signed)
   Subjective:    Patient ID: Jorge Bradshaw, male    DOB: 02/10/1971, 43 y.o.   MRN: [id number]  HPI he is here for a recheck. The MRI did show patellofemoral inflammatory changes.  Review of Systems     Objective:   Physical Exam Alert and in no distress. Right knee exam does show tenderness to palpation to the medial aspect of the patella with positive compression testing. No tenderness over the joint line. McMurray's testing negative. He was observed walking and did walk with a normal gait. No evidence of pronation or supination problems. Normal knee alignment was noted.       Assessment & Plan:  Patellofemoral pain syndrome, right - Plan: Ambulatory referral to Physical Therapy  explained the diagnosis and the fact that multiple issues can be causing this. I will send him to physical therapy for good rehabilitation program. Recheck here in 2 months. We might need to pursue further biomechanical issues at later date.

## 2014-09-23 ENCOUNTER — Encounter: Payer: Self-pay | Admitting: Family Medicine

## 2014-09-23 ENCOUNTER — Ambulatory Visit (INDEPENDENT_AMBULATORY_CARE_PROVIDER_SITE_OTHER): Payer: 59 | Admitting: Family Medicine

## 2014-09-23 VITALS — BP 112/72 | HR 60 | Ht 76.0 in | Wt 218.0 lb

## 2014-09-23 DIAGNOSIS — M222X1 Patellofemoral disorders, right knee: Secondary | ICD-10-CM

## 2014-09-23 NOTE — Progress Notes (Signed)
   Subjective:    Patient ID: Jorge Bradshaw, male    DOB: 03/03/1971, 44 y.o.   MRN: [id number]  HPI He is here for recheck on right knee pain. He did not go to physical therapy. Apparently the appointment was not made. He did start doing some exercises on his own and states that he is doing much better.   Review of Systems     Objective:   Physical Exam Right knee exam shows no effusion. No pain on palpation of the patella. Compression test negative. Anterior drawer and McMurray testing negative.       Assessment & Plan:  Patellofemoral pain syndrome, right  since he is doing better, further intervention not necessary. I did instruct him on terminal extension exercises. If he has more trouble down the road, he will call for another appointment.

## 2016-05-25 ENCOUNTER — Ambulatory Visit (INDEPENDENT_AMBULATORY_CARE_PROVIDER_SITE_OTHER): Payer: Managed Care, Other (non HMO) | Admitting: Family Medicine

## 2016-05-25 ENCOUNTER — Encounter: Payer: Self-pay | Admitting: Family Medicine

## 2016-05-25 VITALS — BP 126/80 | HR 56 | Ht 75.5 in | Wt 209.0 lb

## 2016-05-25 DIAGNOSIS — Z8669 Personal history of other diseases of the nervous system and sense organs: Secondary | ICD-10-CM | POA: Diagnosis not present

## 2016-05-25 DIAGNOSIS — Z8042 Family history of malignant neoplasm of prostate: Secondary | ICD-10-CM | POA: Diagnosis not present

## 2016-05-25 DIAGNOSIS — Z0000 Encounter for general adult medical examination without abnormal findings: Secondary | ICD-10-CM | POA: Diagnosis not present

## 2016-05-25 LAB — CBC WITH DIFFERENTIAL/PLATELET
BASOS PCT: 0 %
Basophils Absolute: 0 cells/uL (ref 0–200)
EOS ABS: 192 {cells}/uL (ref 15–500)
Eosinophils Relative: 3 %
HCT: 43.6 % (ref 38.5–50.0)
Hemoglobin: 14.8 g/dL (ref 13.2–17.1)
Lymphocytes Relative: 36 %
Lymphs Abs: 2304 cells/uL (ref 850–3900)
MCH: 29.3 pg (ref 27.0–33.0)
MCHC: 33.9 g/dL (ref 32.0–36.0)
MCV: 86.3 fL (ref 80.0–100.0)
MPV: 10.1 fL (ref 7.5–12.5)
Monocytes Absolute: 704 cells/uL (ref 200–950)
Monocytes Relative: 11 %
Neutro Abs: 3200 cells/uL (ref 1500–7800)
Neutrophils Relative %: 50 %
Platelets: 221 10*3/uL (ref 140–400)
RBC: 5.05 MIL/uL (ref 4.20–5.80)
RDW: 13.5 % (ref 11.0–15.0)
WBC: 6.4 10*3/uL (ref 4.0–10.5)

## 2016-05-25 NOTE — Progress Notes (Signed)
   Subjective:    Patient ID: Jorge GianottiGregory Bradshaw, male    DOB: 01/22/1971, 45 y.o.   MRN: [id number]  HPI He is here for a complete examination. Today [birth year] his birthday. He has enjoyed excellent health. He does have a remote history of migraine headaches but rarely has difficulty with this. His father apparently had prostate cancer in his mid to late 4360s. He did occasionally smoke cigars but has not smoked them in several years. He has no other concerns or complaints. No chest pain, shortness breath, GI or GU issues. His home life and work are going quite well. Family and social history as well as health maintenance and immunizations were reviewed.  Review of Systems  All other systems reviewed and are negative.      Objective:   Physical Exam BP 126/80   Pulse (!) 56   Ht 6' 3.5" (1.918 m)   Wt 209 lb (94.8 kg)   BMI 25.78 kg/m   General Appearance:    Alert, cooperative, no distress, appears stated age  Head:    Normocephalic, without obvious abnormality, atraumatic  Eyes:    PERRL, conjunctiva/corneas clear, EOM's intact, fundi    benign  Ears:    Normal TM's and external ear canals  Nose:   Nares normal, mucosa normal, no drainage or sinus   tenderness  Throat:   Lips, mucosa, and tongue normal; teeth and gums normal  Neck:   Supple, no lymphadenopathy;  thyroid:  no   enlargement/tenderness/nodules; no carotid   bruit or JVD     Lungs:     Clear to auscultation bilaterally without wheezes, rales or     ronchi; respirations unlabored  Chest Wall:    No tenderness or deformity   Heart:    Regular rate and rhythm, S1 and S2 normal, no murmur, rub   or gallop     Abdomen:     Soft, non-tender, nondistended, normoactive bowel sounds,    no masses, no hepatosplenomegaly  Genitalia:    Normal male external genitalia without lesions.  Testicles without masses.  No inguinal hernias.  Rectal:   Deferred   Extremities:   No clubbing, cyanosis or edema  Pulses:   2+ and symmetric all  extremities  Skin:   Skin color, texture, turgor normal, no rashes or lesions  Lymph nodes:   Cervical, supraclavicular, and axillary nodes normal  Neurologic:   CNII-XII intact, normal strength, sensation and gait; reflexes 2+ and symmetric throughout          Psych:   Normal mood, affect, hygiene and grooming.          Assessment & Plan:  Routine general medical examination at a health care facility - Plan: CBC with Differential/Platelet, Comprehensive metabolic panel, Lipid panel  Family history of prostate cancer in father Discussed prostate cancer screening with him and we will start that later. I will also give him stool cards. Discussed his migraine headache history and at this point nothing further needs to be done. Did encourage him to take an anti-inflammatory at the first sign of a headache.

## 2016-05-26 LAB — LIPID PANEL
CHOL/HDL RATIO: 3.3 ratio (ref <=5.0)
Cholesterol: 160 mg/dL (ref 125–200)
HDL: 49 mg/dL (ref >=40)
LDL Cholesterol: 96 mg/dL (ref <130)
Triglycerides: 76 mg/dL (ref <150)
VLDL: 15 mg/dL (ref <30)

## 2016-05-26 LAB — COMPREHENSIVE METABOLIC PANEL
ALT: 16 U/L (ref 9–46)
AST: 17 U/L (ref 10–40)
Albumin: 4.3 g/dL (ref 3.6–5.1)
Alkaline Phosphatase: 63 U/L (ref 40–115)
BILIRUBIN TOTAL: 0.7 mg/dL (ref 0.2–1.2)
BUN: 12 mg/dL (ref 7–25)
CO2: 27 mmol/L (ref 20–31)
Calcium: 9 mg/dL (ref 8.6–10.3)
Chloride: 105 mmol/L (ref 98–110)
Creat: 1.03 mg/dL (ref 0.60–1.35)
GLUCOSE: 86 mg/dL (ref 65–99)
Potassium: 4.2 mmol/L (ref 3.5–5.3)
Sodium: 140 mmol/L (ref 135–146)
Total Protein: 6.7 g/dL (ref 6.1–8.1)

## 2016-05-28 IMAGING — MR MR KNEE*R* W/O CM
5 series · 40 of 40 positions shown · non-contrast
Comparison: Radiographs dated 01/14/2011

CLINICAL DATA: Intermittent pain and swelling since a basketball
injury in August 2013. Popping.

EXAM:
MRI OF THE RIGHT KNEE WITHOUT CONTRAST
TECHNIQUE: Multiplanar, multisequence MR imaging of the knee was performed. No
intravenous contrast was administered.

[Series 3: PD · axial · 4.0mm · 0.62mm/px · z∈[-53,+67]mm · 10 of 26 slices shown (1 of 2)]
[im 1/26]
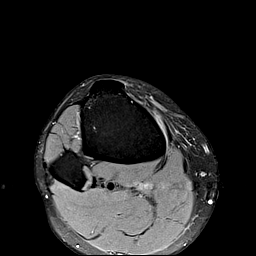
[im 3/26]
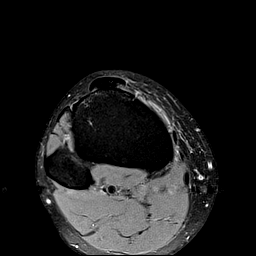
[im 6/26]
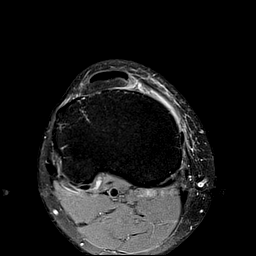
[im 9/26]
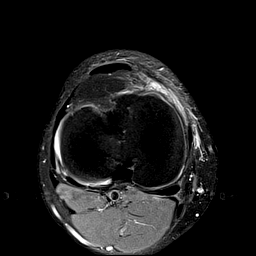
[im 12/26]
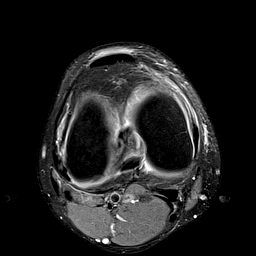
[im 14/26]
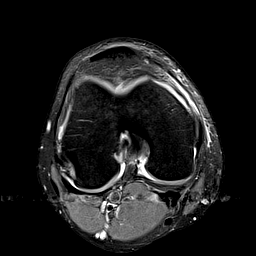
[im 17/26]
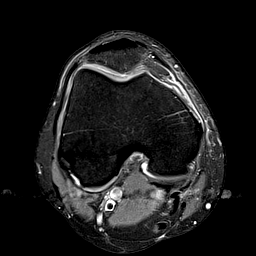
[im 20/26]
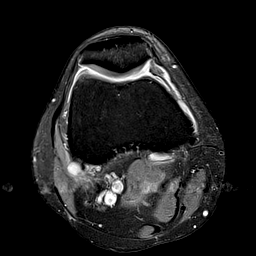
[im 23/26]
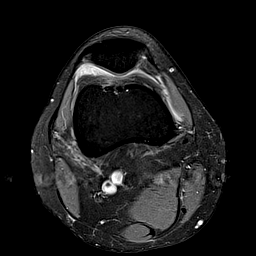
[im 26/26]
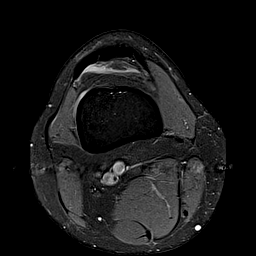

[Series 4: PD fat-sat · sagittal · 4.0mm · 0.62mm/px · 9 of 26 slices shown]
[im 1/26]
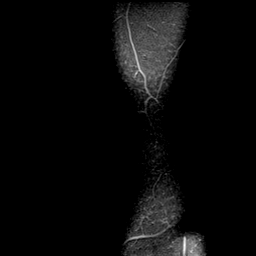
[im 4/26]
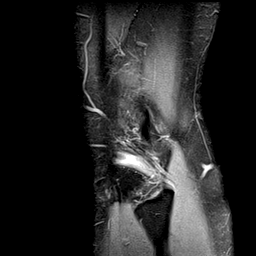
[im 7/26]
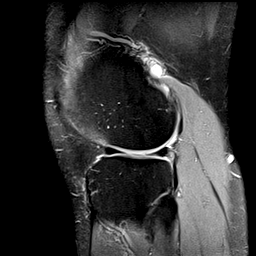
[im 10/26]
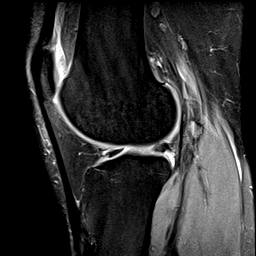
[im 13/26]
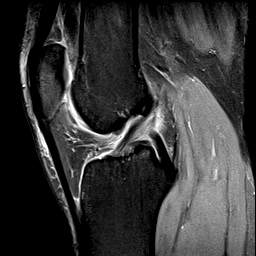
[im 16/26]
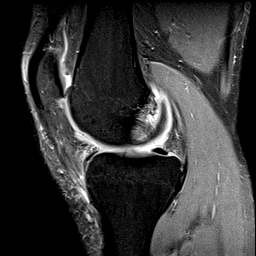
[im 19/26]
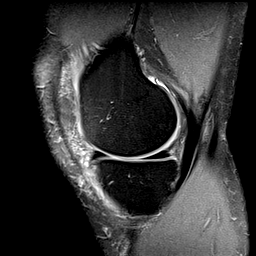
[im 22/26]
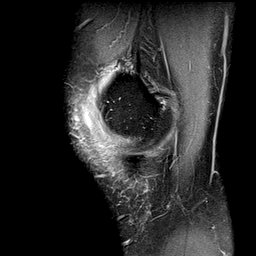
[im 26/26]
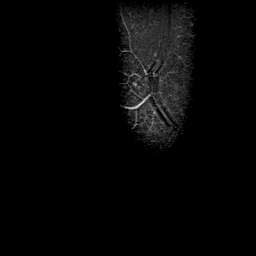

[Series 5: PD · coronal · 4.0mm · 0.66mm/px · 7 of 22 slices shown (2 of 2)]
[im 1/22]
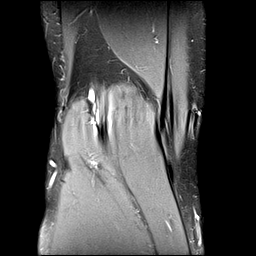
[im 4/22]
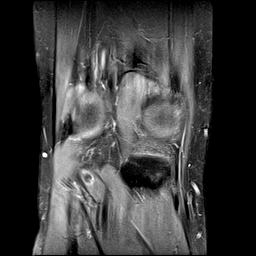
[im 8/22]
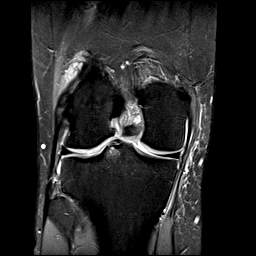
[im 11/22]
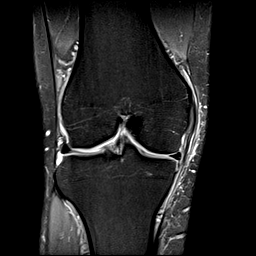
[im 15/22]
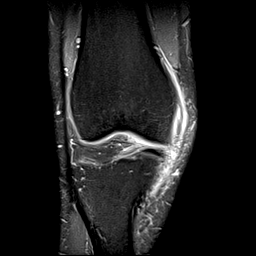
[im 18/22]
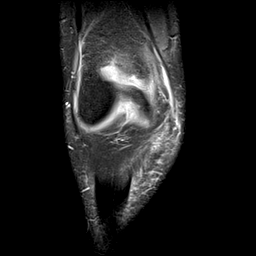
[im 22/22]
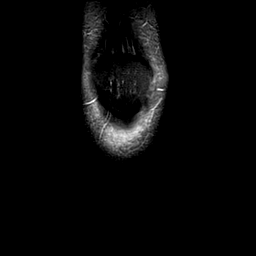

[Series 6: (id) · coronal · 4.0mm · 0.66mm/px · 7 of 22 slices shown]
[im 1/22]
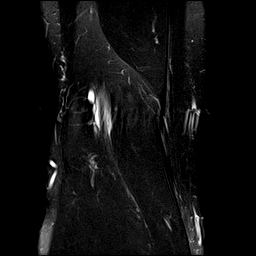
[im 4/22]
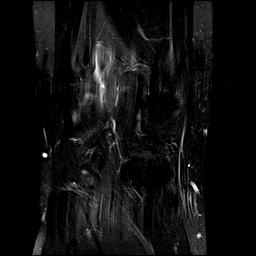
[im 8/22]
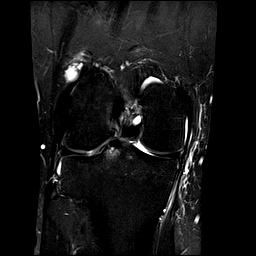
[im 11/22]
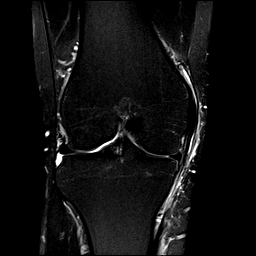
[im 15/22]
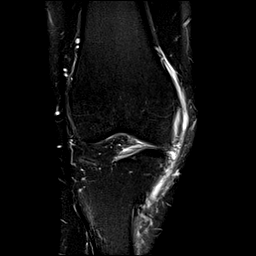
[im 18/22]
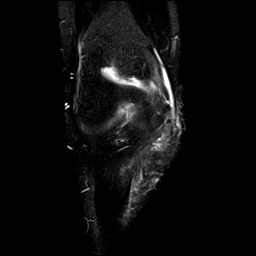
[im 22/22]
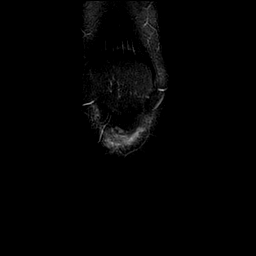

[Series 7: T1 · coronal · 4.0mm · 0.66mm/px · 7 of 22 slices shown]
[im 1/22]
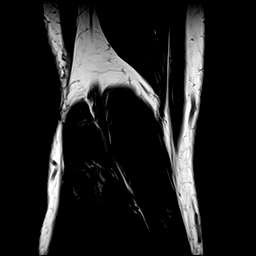
[im 4/22]
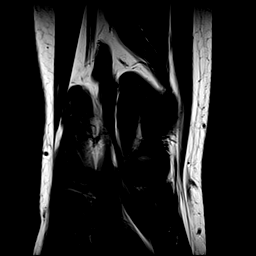
[im 8/22]
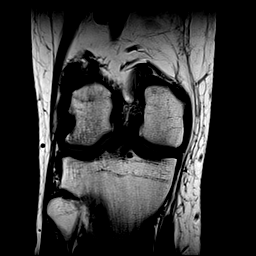
[im 11/22]
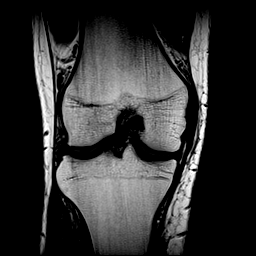
[im 15/22]
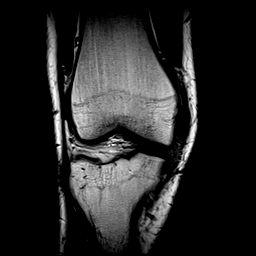
[im 18/22]
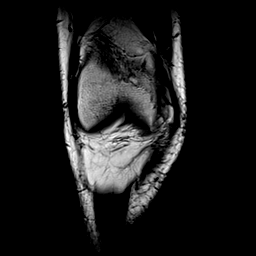
[im 22/22]
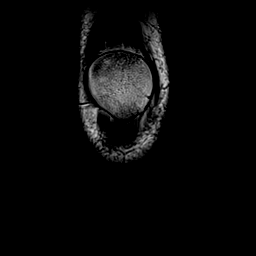

[40 of 40 positions shown; findings below may reference images not displayed]

FINDINGS: MENISCI

Medial meniscus:  Normal.

Lateral meniscus: Fraying and blunting of the free edge of the
midbody.

LIGAMENTS

Cruciates:  Normal.

Collaterals: Slight hypertrophy and abnormal signal from the
proximal MCL consistent with remote MCL sprain. Distal MCL is
normal.

CARTILAGE

Patellofemoral:  Normal.

Medial:  Normal.

Lateral: 10 x 6 mm area of irregular partial-thickness cartilage
loss of the posterior central aspect of the femoral condyle.

Joint:  Small joint effusion.

Popliteal Fossa: No Baker's cyst. 12 mm ganglion cyst adjacent to
the origin of the lateral head of the gastrocnemius.

Extensor Mechanism: There is prominent abnormal edema superficial
and deep to the medial patellofemoral ligament, most prominent
extending from the MCL and extending along the anterior medial
aspect of the proximal tibia and extending deep to the distal MCL.

There are no other signs to suggest previous transient lateral
dislocation of the patella.

Bones:  Normal.
IMPRESSION: Prominent edema superficial and deep to the medial patellofemoral
ligament and deep to the distal MCL with evidence of a remote sprain
of the proximal MCL. This is consistent with probable recurrent
injury to the MCL and medial patellofemoral ligament.

Slight blunting of the free edge of the midbody of the lateral
meniscus with focal chondromalacia of the lateral femoral condyle.

## 2017-10-20 ENCOUNTER — Encounter: Payer: Managed Care, Other (non HMO) | Admitting: Family Medicine

## 2018-06-14 ENCOUNTER — Encounter: Payer: Self-pay | Admitting: Family Medicine

## 2018-06-14 ENCOUNTER — Ambulatory Visit: Payer: Commercial Managed Care - PPO | Admitting: Family Medicine

## 2018-06-14 VITALS — BP 126/84 | HR 51 | Temp 97.9°F | Ht 74.0 in | Wt 205.0 lb

## 2018-06-14 DIAGNOSIS — Z8669 Personal history of other diseases of the nervous system and sense organs: Secondary | ICD-10-CM

## 2018-06-14 DIAGNOSIS — Z0000 Encounter for general adult medical examination without abnormal findings: Secondary | ICD-10-CM | POA: Diagnosis not present

## 2018-06-14 DIAGNOSIS — Z8042 Family history of malignant neoplasm of prostate: Secondary | ICD-10-CM

## 2018-06-14 LAB — COMPREHENSIVE METABOLIC PANEL
ALT: 27 IU/L (ref 0–44)
AST: 22 IU/L (ref 0–40)
Albumin/Globulin Ratio: 2.1 (ref 1.2–2.2)
Albumin: 4.6 g/dL (ref 3.5–5.5)
Alkaline Phosphatase: 76 IU/L (ref 39–117)
BUN/Creatinine Ratio: 10 (ref 9–20)
BUN: 10 mg/dL (ref 6–24)
Bilirubin Total: 0.4 mg/dL (ref 0.0–1.2)
CO2: 24 mmol/L (ref 20–29)
Calcium: 9.5 mg/dL (ref 8.7–10.2)
Chloride: 103 mmol/L (ref 96–106)
Creatinine, Ser: 1 mg/dL (ref 0.76–1.27)
GFR calc Af Amer: 104 mL/min/{1.73_m2} (ref >59)
GFR calc non Af Amer: 90 mL/min/{1.73_m2} (ref >59)
GLUCOSE: 82 mg/dL (ref 65–99)
Globulin, Total: 2.2 g/dL (ref 1.5–4.5)
POTASSIUM: 4.4 mmol/L (ref 3.5–5.2)
SODIUM: 142 mmol/L (ref 134–144)
Total Protein: 6.8 g/dL (ref 6.0–8.5)

## 2018-06-14 LAB — CBC WITH DIFFERENTIAL/PLATELET
Basophils Absolute: 0 10*3/uL (ref 0.0–0.2)
Basos: 0 %
EOS (ABSOLUTE): 0.1 10*3/uL (ref 0.0–0.4)
Eos: 2 %
Hematocrit: 44.1 % (ref 37.5–51.0)
Hemoglobin: 14.7 g/dL (ref 13.0–17.7)
Immature Grans (Abs): 0 10*3/uL (ref 0.0–0.1)
Immature Granulocytes: 1 %
LYMPHS ABS: 1.8 10*3/uL (ref 0.7–3.1)
LYMPHS: 28 %
MCH: 28.9 pg (ref 26.6–33.0)
MCHC: 33.3 g/dL (ref 31.5–35.7)
MCV: 87 fL (ref 79–97)
Monocytes Absolute: 0.6 10*3/uL (ref 0.1–0.9)
Monocytes: 9 %
NEUTROS ABS: 3.7 10*3/uL (ref 1.4–7.0)
Neutrophils: 60 %
PLATELETS: 254 10*3/uL (ref 150–450)
RBC: 5.08 x10E6/uL (ref 4.14–5.80)
RDW: 13 % (ref 12.3–15.4)
WBC: 6.2 10*3/uL (ref 3.4–10.8)

## 2018-06-14 LAB — LIPID PANEL
CHOLESTEROL TOTAL: 177 mg/dL (ref 100–199)
Chol/HDL Ratio: 3.2 ratio (ref 0.0–5.0)
HDL: 56 mg/dL (ref >39)
LDL Calculated: 109 mg/dL — ABNORMAL HIGH (ref 0–99)
Triglycerides: 58 mg/dL (ref 0–149)
VLDL Cholesterol Cal: 12 mg/dL (ref 5–40)

## 2018-06-14 LAB — POCT URINALYSIS DIP (PROADVANTAGE DEVICE)
BILIRUBIN UA: NEGATIVE mg/dL
Bilirubin, UA: NEGATIVE
Blood, UA: NEGATIVE
Glucose, UA: NEGATIVE mg/dL
LEUKOCYTES UA: NEGATIVE
Nitrite, UA: NEGATIVE
PH UA: 7.5 (ref 5.0–8.0)
PROTEIN UA: NEGATIVE mg/dL
SPECIFIC GRAVITY, URINE: 1.015
Urobilinogen, Ur: 3.5

## 2018-06-14 NOTE — Progress Notes (Signed)
   Subjective:    Patient ID: Jorge Bradshaw, male    DOB: 1970-10-02, 47 y.o.   MRN: [id number]  HPI He is here for complete examination.  He has no particular concerns or complaints.  He does have a history of migraine headaches but has not had difficulty with that in quite some time.  There is a family history of prostate cancer.  He is also a former cigar smoker.  His marriage and home life are going well.  Family and social history as well as health maintenance and immunizations was reviewed.   Review of Systems  All other systems reviewed and are negative.      Objective:   Physical Exam BP 126/84 (BP Location: Left Arm, Patient Position: Sitting)   Pulse (!) 51   Temp 97.9 F (36.6 C)   Ht 6\' 2"  (1.88 m)   Wt 205 lb (93 kg)   SpO2 99%   BMI 26.32 kg/m   General Appearance:    Alert, cooperative, no distress, appears stated age  Head:    Normocephalic, without obvious abnormality, atraumatic  Eyes:    PERRL, conjunctiva/corneas clear, EOM's intact, fundi    benign  Ears:    Normal TM's and external ear canals  Nose:   Nares normal, mucosa normal, no drainage or sinus   tenderness  Throat:   Lips, mucosa, and tongue normal; teeth and gums normal  Neck:   Supple, no lymphadenopathy;  thyroid:  no   enlargement/tenderness/nodules; no carotid   bruit or JVD     Lungs:     Clear to auscultation bilaterally without wheezes, rales or     ronchi; respirations unlabored      Heart:    Regular rate and rhythm, S1 and S2 normal, no murmur, rub   or gallop     Abdomen:     Soft, non-tender, nondistended, normoactive bowel sounds,    no masses, no hepatosplenomegaly  Genitalia:   Deferred  Rectal:   Deferred guaiac cards given  Extremities:   No clubbing, cyanosis or edema  Pulses:   2+ and symmetric all extremities  Skin:   Skin color, texture, turgor normal, no rashes or lesions  Lymph nodes:   Cervical, supraclavicular, and axillary nodes normal  Neurologic:   CNII-XII  intact, normal strength, sensation and gait; reflexes 2+ and symmetric throughout          Psych:   Normal mood, affect, hygiene and grooming.           Assessment & Plan:  Routine general medical examination at a health care facility - Plan: Comprehensive metabolic panel, CBC with Differential/Platelet, Lipid panel  History of migraine headaches  Family history of prostate cancer in father Encouraged him to continue to take good care of himself. He did not want a flu shot.

## 2018-06-14 NOTE — Addendum Note (Signed)
Addended by: Renelda Loma on: 06/14/2018 12:12 PM   Modules accepted: Orders

## 2018-06-14 NOTE — Patient Instructions (Signed)
To treat your migraine use the pressure points but also early in the process take 2 Aleve twice per day

## 2018-09-05 DIAGNOSIS — M9903 Segmental and somatic dysfunction of lumbar region: Secondary | ICD-10-CM | POA: Diagnosis not present

## 2018-09-05 DIAGNOSIS — M5386 Other specified dorsopathies, lumbar region: Secondary | ICD-10-CM | POA: Diagnosis not present

## 2018-09-05 DIAGNOSIS — M9902 Segmental and somatic dysfunction of thoracic region: Secondary | ICD-10-CM | POA: Diagnosis not present

## 2018-10-10 DIAGNOSIS — M5386 Other specified dorsopathies, lumbar region: Secondary | ICD-10-CM | POA: Diagnosis not present

## 2018-10-10 DIAGNOSIS — M9903 Segmental and somatic dysfunction of lumbar region: Secondary | ICD-10-CM | POA: Diagnosis not present

## 2018-10-10 DIAGNOSIS — M9902 Segmental and somatic dysfunction of thoracic region: Secondary | ICD-10-CM | POA: Diagnosis not present
